# Patient Record
Sex: Male | Born: 1995 | Race: White | Hispanic: No | Marital: Single | Smoking: Never smoker
Health system: Southern US, Community
[De-identification: ages and names within clinical notes are randomized; demographics above are authoritative.]

## PROBLEM LIST (undated history)

## (undated) DIAGNOSIS — F840 Autistic disorder: Secondary | ICD-10-CM

## (undated) DIAGNOSIS — F319 Bipolar disorder, unspecified: Secondary | ICD-10-CM

---
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
## 2016-01-01 ENCOUNTER — Other Ambulatory Visit (HOSPITAL_COMMUNITY): Payer: Self-pay

## 2016-01-04 ENCOUNTER — Other Ambulatory Visit (HOSPITAL_COMMUNITY): Payer: Self-pay

## 2016-01-05 ENCOUNTER — Ambulatory Visit (HOSPITAL_COMMUNITY): Payer: Self-pay

## 2016-01-05 ENCOUNTER — Other Ambulatory Visit (HOSPITAL_COMMUNITY): Payer: Self-pay

## 2016-01-06 ENCOUNTER — Other Ambulatory Visit (HOSPITAL_COMMUNITY): Payer: Self-pay

## 2016-01-07 ENCOUNTER — Other Ambulatory Visit (HOSPITAL_COMMUNITY): Payer: Self-pay

## 2016-01-07 ENCOUNTER — Ambulatory Visit (HOSPITAL_COMMUNITY): Payer: Self-pay

## 2016-01-08 ENCOUNTER — Other Ambulatory Visit (HOSPITAL_COMMUNITY): Payer: Self-pay

## 2016-01-12 ENCOUNTER — Ambulatory Visit (HOSPITAL_COMMUNITY): Payer: Self-pay

## 2016-01-13 ENCOUNTER — Other Ambulatory Visit (HOSPITAL_COMMUNITY): Payer: Self-pay

## 2016-01-14 ENCOUNTER — Other Ambulatory Visit (HOSPITAL_COMMUNITY): Payer: Self-pay

## 2016-01-14 ENCOUNTER — Ambulatory Visit (HOSPITAL_COMMUNITY): Payer: Self-pay

## 2016-01-15 ENCOUNTER — Other Ambulatory Visit (HOSPITAL_COMMUNITY): Payer: Self-pay

## 2016-01-19 ENCOUNTER — Ambulatory Visit (HOSPITAL_COMMUNITY): Payer: Self-pay

## 2016-01-21 ENCOUNTER — Ambulatory Visit (HOSPITAL_COMMUNITY): Payer: Self-pay

## 2016-02-01 ENCOUNTER — Emergency Department (HOSPITAL_COMMUNITY)
Admission: EM | Admit: 2016-02-01 | Discharge: 2016-02-05 | Disposition: A | Discharge status: Home / Self Care | Payer: Commercial Managed Care - PPO | Attending: Emergency Medicine | Admitting: Emergency Medicine

## 2016-02-01 DIAGNOSIS — Y999 Unspecified external cause status: Secondary | ICD-10-CM | POA: Insufficient documentation

## 2016-02-01 DIAGNOSIS — Y929 Unspecified place or not applicable: Secondary | ICD-10-CM | POA: Insufficient documentation

## 2016-02-01 DIAGNOSIS — Z644 Discord with counselors: Secondary | ICD-10-CM | POA: Diagnosis not present

## 2016-02-01 DIAGNOSIS — Z658 Other specified problems related to psychosocial circumstances: Secondary | ICD-10-CM

## 2016-02-01 DIAGNOSIS — Z79899 Other long term (current) drug therapy: Secondary | ICD-10-CM | POA: Diagnosis not present

## 2016-02-01 DIAGNOSIS — X781XXA Intentional self-harm by knife, initial encounter: Secondary | ICD-10-CM | POA: Diagnosis not present

## 2016-02-01 DIAGNOSIS — S50812A Abrasion of left forearm, initial encounter: Secondary | ICD-10-CM

## 2016-02-01 DIAGNOSIS — S6992XA Unspecified injury of left wrist, hand and finger(s), initial encounter: Secondary | ICD-10-CM | POA: Diagnosis present

## 2016-02-01 DIAGNOSIS — Y939 Activity, unspecified: Secondary | ICD-10-CM | POA: Diagnosis not present

## 2016-02-01 DIAGNOSIS — F840 Autistic disorder: Secondary | ICD-10-CM | POA: Insufficient documentation

## 2016-02-01 LAB — CBC
HEMATOCRIT: 49.1 % (ref 39.0–52.0)
Hemoglobin: 16.7 g/dL (ref 13.0–17.0)
MCH: 30.4 pg (ref 26.0–34.0)
MCHC: 34 g/dL (ref 30.0–36.0)
MCV: 89.4 fL (ref 78.0–100.0)
Platelets: 250 10*3/uL (ref 150–400)
RBC: 5.49 MIL/uL (ref 4.22–5.81)
RDW: 12.1 % (ref 11.5–15.5)
WBC: 5.3 10*3/uL (ref 4.0–10.5)

## 2016-02-01 LAB — COMPREHENSIVE METABOLIC PANEL
ALBUMIN: 3.9 g/dL (ref 3.5–5.0)
ALT: 14 U/L — ABNORMAL LOW (ref 17–63)
ANION GAP: 8 (ref 5–15)
AST: 17 U/L (ref 15–41)
Alkaline Phosphatase: 83 U/L (ref 38–126)
BILIRUBIN TOTAL: 0.9 mg/dL (ref 0.3–1.2)
BUN: 8 mg/dL (ref 6–20)
CALCIUM: 9.8 mg/dL (ref 8.9–10.3)
CO2: 29 mmol/L (ref 22–32)
Chloride: 104 mmol/L (ref 101–111)
Creatinine, Ser: 1.14 mg/dL (ref 0.61–1.24)
GFR calc Af Amer: >60 mL/min (ref >60)
GFR calc non Af Amer: >60 mL/min (ref >60)
GLUCOSE: 92 mg/dL (ref 65–99)
Potassium: 4.6 mmol/L (ref 3.5–5.1)
Sodium: 141 mmol/L (ref 135–145)
TOTAL PROTEIN: 7.5 g/dL (ref 6.5–8.1)

## 2016-02-01 LAB — RAPID URINE DRUG SCREEN, HOSP PERFORMED
Amphetamines: NOT DETECTED
BARBITURATES: NOT DETECTED
Benzodiazepines: NOT DETECTED
COCAINE: NOT DETECTED
Opiates: NOT DETECTED
Tetrahydrocannabinol: NOT DETECTED

## 2016-02-01 LAB — SALICYLATE LEVEL: Salicylate Lvl: <7 mg/dL (ref 2.8–30.0)

## 2016-02-01 LAB — ACETAMINOPHEN LEVEL

## 2016-02-01 LAB — ETHANOL: Alcohol, Ethyl (B): <5 mg/dL (ref <5)

## 2016-02-01 LAB — VALPROIC ACID LEVEL: VALPROIC ACID LVL: 32 ug/mL — AB (ref 50.0–100.0)

## 2016-02-01 MED ORDER — TRAZODONE HCL 50 MG PO TABS
50.0000 mg | ORAL_TABLET | Freq: Every evening | ORAL | Status: DC | PRN
  Administered 2016-02-01 – 2016-02-03 (×3): 50 mg via ORAL
  Filled 2016-02-01 (×3): qty 1

## 2016-02-01 MED ORDER — HYDROXYZINE HCL 25 MG PO TABS
25.0000 mg | ORAL_TABLET | Freq: Four times a day (QID) | ORAL | Status: DC | PRN
  Administered 2016-02-01: 25 mg via ORAL
  Filled 2016-02-01: qty 1

## 2016-02-01 MED ORDER — ARIPIPRAZOLE 5 MG PO TABS
15.0000 mg | ORAL_TABLET | Freq: Every day | ORAL | Status: DC
  Administered 2016-02-02 – 2016-02-05 (×4): 15 mg via ORAL
  Filled 2016-02-01 (×4): qty 1

## 2016-02-01 MED ORDER — DIVALPROEX SODIUM ER 500 MG PO TB24
500.0000 mg | ORAL_TABLET | Freq: Every day | ORAL | Status: DC
  Administered 2016-02-01 – 2016-02-03 (×3): 500 mg via ORAL
  Filled 2016-02-01 (×3): qty 1

## 2016-02-01 NOTE — BH Assessment (Signed)
[facility] [place], [place], [place], [place]
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
## 2016-02-02 DIAGNOSIS — S50812A Abrasion of left forearm, initial encounter: Secondary | ICD-10-CM | POA: Diagnosis not present

## 2016-02-02 DIAGNOSIS — Z644 Discord with counselors: Secondary | ICD-10-CM | POA: Diagnosis not present

## 2016-02-02 NOTE — ED Notes (Signed)
Pt provided a snack.  
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
## 2016-02-02 NOTE — ED Notes (Signed)
A regular diet was ordered for Lunch. 

## 2016-02-02 NOTE — ED Notes (Signed)
Patient was given a snack and drink. 

## 2016-02-03 DIAGNOSIS — S50812A Abrasion of left forearm, initial encounter: Secondary | ICD-10-CM | POA: Diagnosis not present

## 2016-02-03 DIAGNOSIS — Z644 Discord with counselors: Secondary | ICD-10-CM | POA: Diagnosis not present

## 2016-02-03 NOTE — ED Notes (Signed)
Regular Diet has been ordered for Lunch. 

## 2016-02-03 NOTE — ED Notes (Signed)
Pt taken for shower.

## 2016-02-03 NOTE — ED Notes (Signed)
Pt received phone call from his brother. At desk presently, speaking on phone.

## 2016-02-03 NOTE — ED Notes (Signed)
A snack and drink was given to pt. And a order was taken for Lunch.

## 2016-02-03 NOTE — ED Provider Notes (Signed)
Sleeping comfortably   Kumar Falwell, MD 02/03/16 0905  

## 2016-02-03 NOTE — [facility] Counselor (Signed)
Client reassessed.  He continued to deny suicidal or homicidal ideation, reported that he wanted to return home.  Pt denied incidents detailed in the assessment, namely that he cut his forearm, locked himself in his room, and threatened to kill himself.  Consulted w/L Arville CareParks, NP who recommended continued inpatient.

## 2016-02-04 DIAGNOSIS — F840 Autistic disorder: Secondary | ICD-10-CM | POA: Diagnosis present

## 2016-02-04 DIAGNOSIS — Z644 Discord with counselors: Secondary | ICD-10-CM | POA: Diagnosis not present

## 2016-02-04 DIAGNOSIS — S50812A Abrasion of left forearm, initial encounter: Secondary | ICD-10-CM | POA: Diagnosis not present

## 2016-02-04 MED ORDER — DIVALPROEX SODIUM ER 500 MG PO TB24
1000.0000 mg | ORAL_TABLET | Freq: Every day | ORAL | Status: DC
Start: 2016-02-04 — End: 2016-02-05
  Administered 2016-02-04: 1000 mg via ORAL
  Filled 2016-02-04: qty 2

## 2016-02-04 NOTE — ED Notes (Signed)
[facility] about Mother's expressed concerns. NP will be speaking with [facility] about [facility].
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
## 2016-02-04 NOTE — ED Notes (Signed)
Lunch tray ordered for patient.

## 2016-02-04 NOTE — [facility] Counselor (Signed)
Pt denies SI/HI and AVH. Pt was calm and cooperative. Pt was coloring. Pt did not have any questions or concerns.  Wolfgang PhoenixBrandi Ane Conerly, [facility] Triage Specialist

## 2016-02-04 NOTE — ED Notes (Signed)
Spoke with patient's father. He is expressing possible concerns with D/C today due to possible delay in psychiatric treatment in outpatient setting. Father is requesting we speak with patient's mother.

## 2016-02-04 NOTE — ED Notes (Addendum)
This RN spoke with patient's mother and father about [facility]. Mother will call back in the A.M. to determine if d/c is still a likely outcome and address any remaining concerns. Mother indicated that the patient's psychiatrist is Dr. Jannifer FranklinAkintayo with [facility].  Father - Amparo Bristoldward Wentz - [phone]- [phone] Mother - Evonnie DawesLinda Woerner - [phone]

## 2016-02-04 NOTE — ED Notes (Signed)
Pt. Online with TTS.

## 2016-02-04 NOTE — Consult Note (Signed)
02-04-16  [name] received a telephone call from ClaiborneDustin, Charity fundraiserN at [facility] regarding family concerns over recommendation to discharge Pt today. Both parents are concerned that Pt will return home and decompensate without medication adjustment. The earliest appoint patient can get for outpatient psychiatry is Jan 27. Pt's mother would like medication adjustment prior to discharge. Pt is currently on Abilify 15 mg QD and Depakote ER 500 mg QHS. Pt's valporic acid level is sub therapeutic at 32. This writer would recommend the following:  Increase Depakote ER from to 1000mg  QHS PO  prior to discharge for mood stabilization.    Laveda AbbeLaurie Britton Parks MSN, FNP-BC [facility]

## 2016-02-05 DIAGNOSIS — Z644 Discord with counselors: Secondary | ICD-10-CM | POA: Diagnosis not present

## 2016-02-05 DIAGNOSIS — S50812A Abrasion of left forearm, initial encounter: Secondary | ICD-10-CM | POA: Diagnosis not present

## 2016-02-05 NOTE — Discharge Instructions (Signed)
Follow-up as per directions given by behavioral health staff.

## 2016-02-05 NOTE — Progress Notes (Signed)
CSW contacted pt's mother regarding guardianship concerns. CSW informed her to go to [facility] and file petition. CSW explained the process of petitioning and going before a judge. CSW also explained court system procedure and evaluation needs. Pt's medication has been adjusted by psych NP. Pt ready for d/c. Pt's mom to coordinate with pt's dad for p/u. CSW f/u with [facility], who stated f/u appt scheduled for 03/15/16. [phone]). CSW was able to get an earlier appt for 02/15/16 at 2:45pm. Information put on AVS. CSW signing off. Please contact should need arise.   Corlis HoveJeneya Zairah Arista, MSW, Theresia MajorsLCSWA [phone]

## 2016-02-05 NOTE — ED Notes (Signed)
Regular Diet has been Ordered. 

## 2016-02-05 NOTE — ED Notes (Signed)
Regular diet tray ordered for breakfast. 

## 2016-02-05 NOTE — ED Notes (Signed)
Patient was given a snack and drink, and a Regular Diet was taken for Lunch. 

## 2016-05-09 ENCOUNTER — Encounter (HOSPITAL_COMMUNITY): Payer: Self-pay | Admitting: Emergency Medicine

## 2016-05-09 DIAGNOSIS — Z79899 Other long term (current) drug therapy: Secondary | ICD-10-CM | POA: Insufficient documentation

## 2016-05-09 DIAGNOSIS — W2201XA Walked into wall, initial encounter: Secondary | ICD-10-CM | POA: Diagnosis not present

## 2016-05-09 DIAGNOSIS — Y999 Unspecified external cause status: Secondary | ICD-10-CM | POA: Diagnosis not present

## 2016-05-09 DIAGNOSIS — S0990XA Unspecified injury of head, initial encounter: Secondary | ICD-10-CM | POA: Diagnosis not present

## 2016-05-09 DIAGNOSIS — Y92410 Unspecified street and highway as the place of occurrence of the external cause: Secondary | ICD-10-CM | POA: Insufficient documentation

## 2016-05-09 DIAGNOSIS — Y939 Activity, unspecified: Secondary | ICD-10-CM | POA: Diagnosis not present
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
## 2016-05-10 ENCOUNTER — Emergency Department (HOSPITAL_COMMUNITY)
Admission: EM | Admit: 2016-05-10 | Discharge: 2016-05-10 | Disposition: A | Discharge status: Home / Self Care | Payer: Commercial Managed Care - PPO | Attending: Emergency Medicine | Admitting: Emergency Medicine

## 2016-05-10 ENCOUNTER — Emergency Department (HOSPITAL_COMMUNITY): Payer: Commercial Managed Care - PPO

## 2016-05-10 DIAGNOSIS — Z79899 Other long term (current) drug therapy: Secondary | ICD-10-CM | POA: Diagnosis not present

## 2016-05-10 DIAGNOSIS — S0990XA Unspecified injury of head, initial encounter: Secondary | ICD-10-CM | POA: Diagnosis not present

## 2016-05-10 DIAGNOSIS — R42 Dizziness and giddiness: Secondary | ICD-10-CM | POA: Diagnosis not present
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
## 2016-05-17 DIAGNOSIS — H9201 Otalgia, right ear: Secondary | ICD-10-CM | POA: Diagnosis not present

## 2016-05-17 DIAGNOSIS — H6121 Impacted cerumen, right ear: Secondary | ICD-10-CM | POA: Diagnosis not present

## 2017-04-18 DIAGNOSIS — Z13220 Encounter for screening for lipoid disorders: Secondary | ICD-10-CM | POA: Diagnosis not present

## 2017-04-18 DIAGNOSIS — Z0000 Encounter for general adult medical examination without abnormal findings: Secondary | ICD-10-CM | POA: Diagnosis not present

## 2017-04-18 DIAGNOSIS — Z130 Encounter for screening for diseases of the blood and blood-forming organs and certain disorders involving the immune mechanism: Secondary | ICD-10-CM | POA: Diagnosis not present

## 2017-04-28 DIAGNOSIS — Z130 Encounter for screening for diseases of the blood and blood-forming organs and certain disorders involving the immune mechanism: Secondary | ICD-10-CM | POA: Diagnosis not present

## 2017-04-28 DIAGNOSIS — Z0000 Encounter for general adult medical examination without abnormal findings: Secondary | ICD-10-CM | POA: Diagnosis not present

## 2017-04-28 DIAGNOSIS — Z13220 Encounter for screening for lipoid disorders: Secondary | ICD-10-CM | POA: Diagnosis not present

## 2017-10-04 DIAGNOSIS — M5431 Sciatica, right side: Secondary | ICD-10-CM | POA: Diagnosis not present

## 2017-10-04 DIAGNOSIS — M5441 Lumbago with sciatica, right side: Secondary | ICD-10-CM | POA: Diagnosis not present

## 2018-01-03 DIAGNOSIS — S62336A Displaced fracture of neck of fifth metacarpal bone, right hand, initial encounter for closed fracture: Secondary | ICD-10-CM | POA: Diagnosis not present

## 2018-01-03 DIAGNOSIS — W228XXA Striking against or struck by other objects, initial encounter: Secondary | ICD-10-CM | POA: Diagnosis not present

## 2018-01-03 DIAGNOSIS — S6991XA Unspecified injury of right wrist, hand and finger(s), initial encounter: Secondary | ICD-10-CM | POA: Diagnosis not present

## 2018-01-04 DIAGNOSIS — S62326A Displaced fracture of shaft of fifth metacarpal bone, right hand, initial encounter for closed fracture: Secondary | ICD-10-CM | POA: Diagnosis not present

## 2018-01-04 DIAGNOSIS — S62336A Displaced fracture of neck of fifth metacarpal bone, right hand, initial encounter for closed fracture: Secondary | ICD-10-CM | POA: Diagnosis not present

## 2018-01-04 DIAGNOSIS — S6991XA Unspecified injury of right wrist, hand and finger(s), initial encounter: Secondary | ICD-10-CM | POA: Diagnosis not present

## 2018-01-04 DIAGNOSIS — W228XXA Striking against or struck by other objects, initial encounter: Secondary | ICD-10-CM | POA: Diagnosis not present

## 2018-01-07 DIAGNOSIS — L03012 Cellulitis of left finger: Secondary | ICD-10-CM | POA: Diagnosis not present

## 2018-01-08 DIAGNOSIS — L03012 Cellulitis of left finger: Secondary | ICD-10-CM | POA: Diagnosis not present

## 2018-01-08 DIAGNOSIS — S62396D Other fracture of fifth metacarpal bone, right hand, subsequent encounter for fracture with routine healing: Secondary | ICD-10-CM | POA: Diagnosis not present

## 2018-02-14 IMAGING — CT CT HEAD W/O CM
4 series · 17 of 47 positions shown, 19 images · non-contrast
Comparison: None.

CLINICAL DATA: Acute onset of dizziness after ramming head into
wall. Initial encounter.

EXAM:
CT HEAD WITHOUT CONTRAST
TECHNIQUE: Contiguous axial images were obtained from the base of the skull
through the vertex without intravenous contrast.

[Series 3: head without · axial · non-contrast · 0.46mm/px · z∈[-107,+23]mm · 7 of 36 slices shown, 9 images]
[im 5/36  brain]
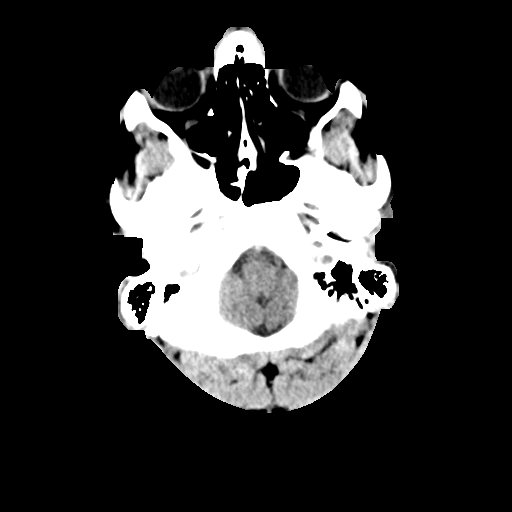
[im 5/36  bone]
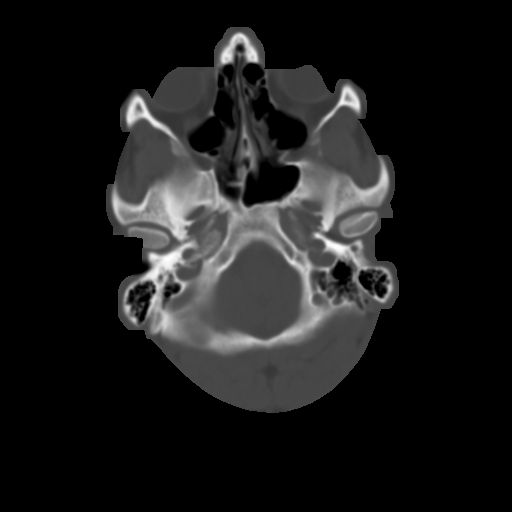
[im 9/36  brain]
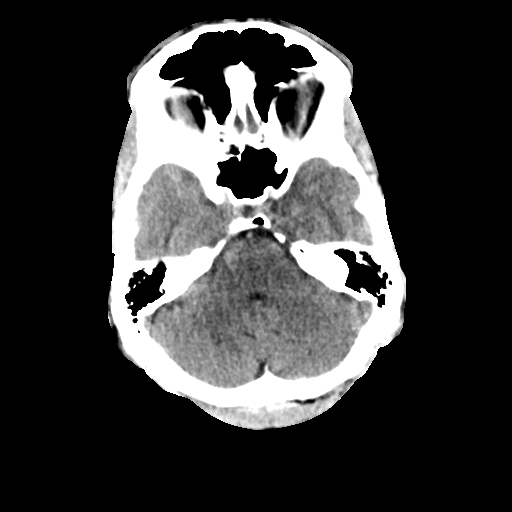
[im 14/36  brain]
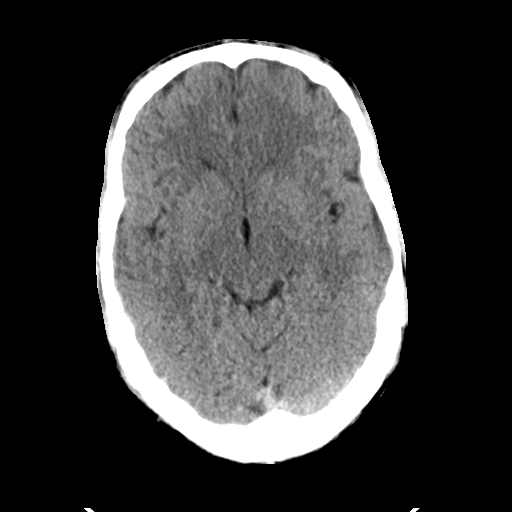
[im 18/36  brain]
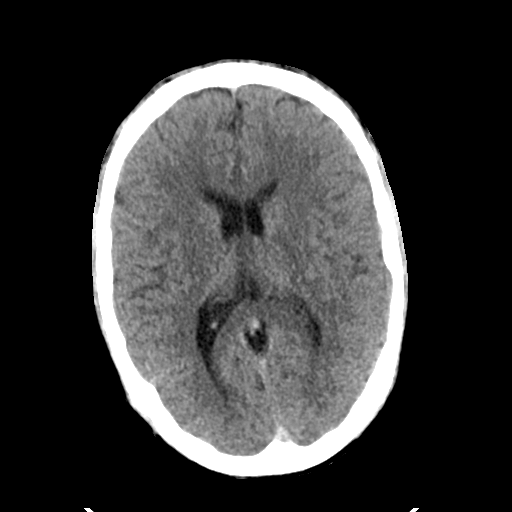
[im 22/36  brain]
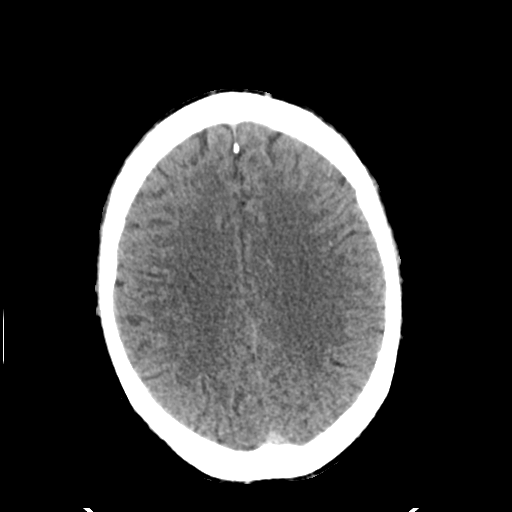
[im 22/36  bone]
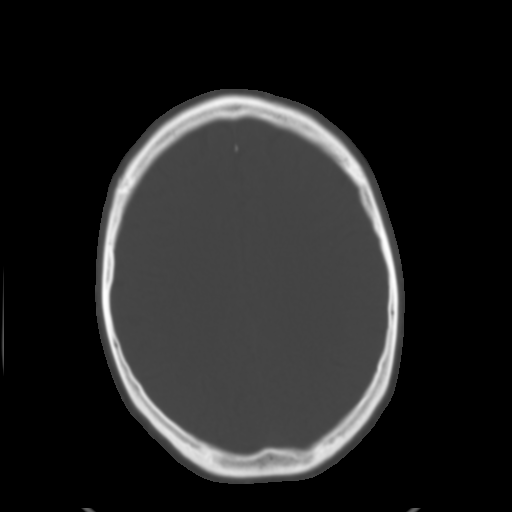
[im 27/36  brain]
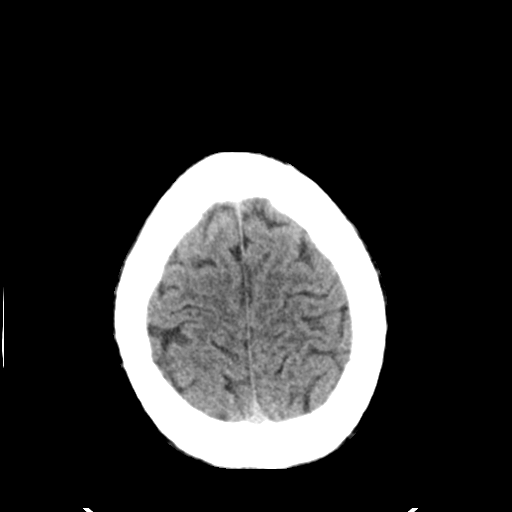
[im 31/36  brain]
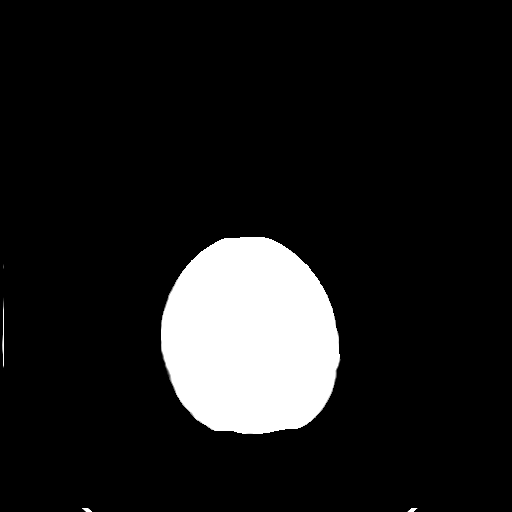

[Series 4: head bone · axial · 0.46mm/px · z∈[-111,-49]mm · 4 of 89 slices shown]
[im 9/89  bone]
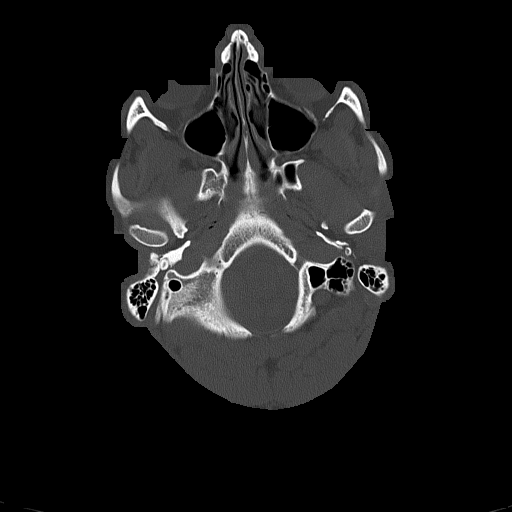
[im 18/89  bone]
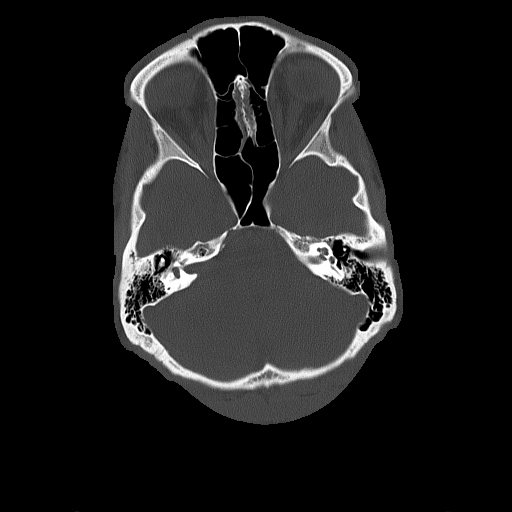
[im 27/89  bone]
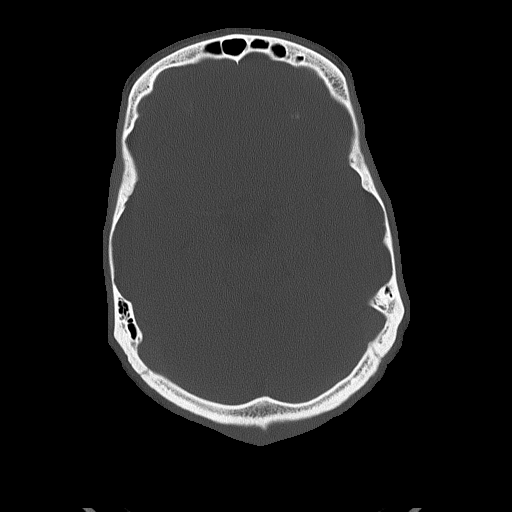
[im 40/89  bone]
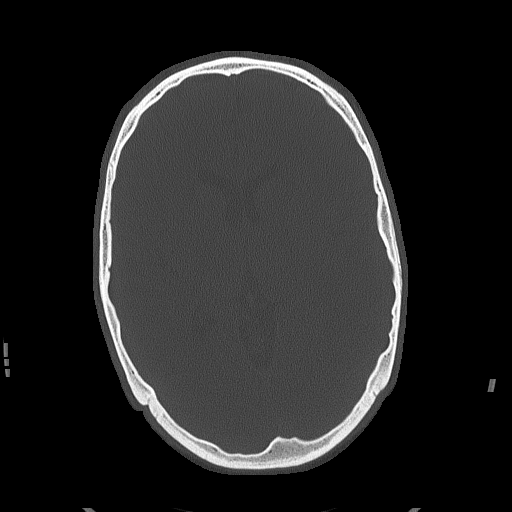

[Series 5: head without cor · coronal · non-contrast · 0.34mm/px · 3 of 72 slices shown]
[im 24/72  brain]
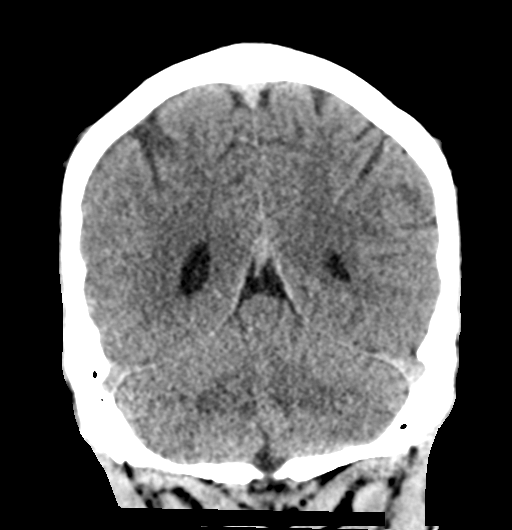
[im 32/72  brain]
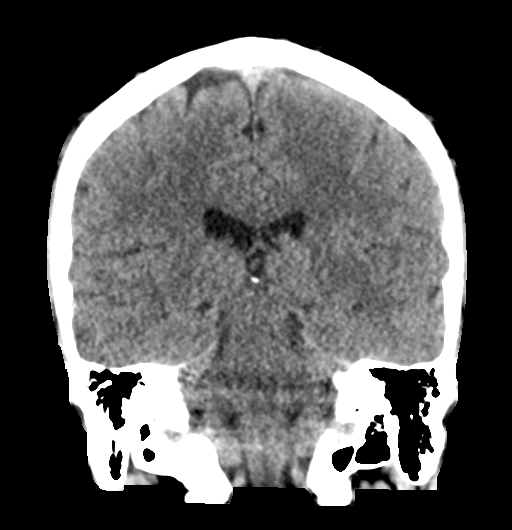
[im 40/72  brain]
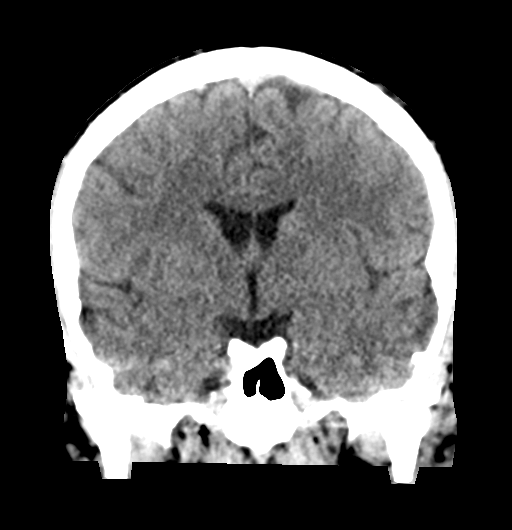

[Series 6: head without sag · sagittal · non-contrast · 0.38mm/px · 3 of 55 slices shown]
[im 19/55  brain]
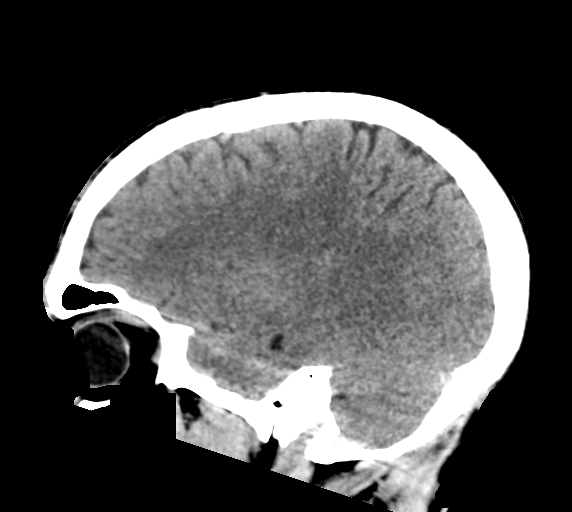
[im 28/55  brain]
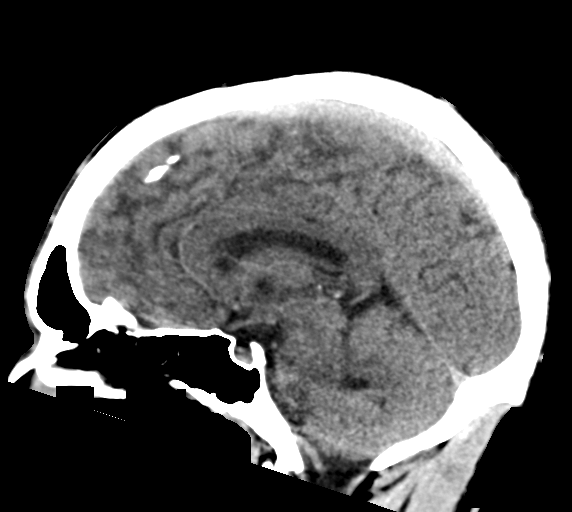
[im 37/55  brain]
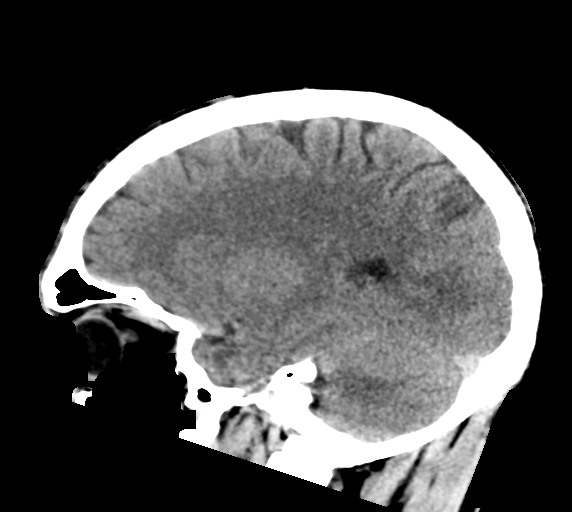

[17 of 47 positions shown; findings below may reference images not displayed]

FINDINGS: Brain: No evidence of acute infarction, hemorrhage, hydrocephalus,
extra-axial collection or mass lesion/mass effect.

The posterior fossa, including the cerebellum, brainstem and fourth
ventricle, is within normal limits. The third and lateral
ventricles, and basal ganglia are unremarkable in appearance. The
cerebral hemispheres are symmetric in appearance, with normal
gray-white differentiation. No mass effect or midline shift is seen.

Vascular: No hyperdense vessel or unexpected calcification.

Skull: There is no evidence of fracture; visualized osseous
structures are unremarkable in appearance.

Sinuses/Orbits: The orbits are within normal limits. The paranasal
sinuses and mastoid air cells are well-aerated.

Other: No significant soft tissue abnormalities are seen.
IMPRESSION: No evidence of traumatic intracranial injury or fracture.

## 2018-02-21 DIAGNOSIS — S62339A Displaced fracture of neck of unspecified metacarpal bone, initial encounter for closed fracture: Secondary | ICD-10-CM | POA: Diagnosis not present

## 2020-10-17 DIAGNOSIS — S90112A Contusion of left great toe without damage to nail, initial encounter: Secondary | ICD-10-CM

## 2020-10-17 NOTE — Discharge Instructions (Signed)
Your x-ray shows no evidence of broken bones.  You are likely dealing with contusion versus sprain/strain type injury.  Utilize Tylenol/ibuprofen as needed for pain and inflammation.    Utilize "PRICER" to guide your rehabilitation and recovery until you are able to follow up with your primary care provider or specialist.  P - Protect the injury: Immobilization (intermittently, unless instructed otherwise) using splinting, braces, crutches, etc.  R - Rest the injury: But not for too long! Excessive immobilization leads to joint stiffness - gentle stretches as dictated by pain; early mobilization when pain free!  I - Ice the injury: Not directly on the skin (use towel or other barrier to cover the ice) - ice in 20-30 minute intervals.  C - Compression: Reduces swelling and reduces excess tissue fluids, helps with venous blood return. Ace bandage, not too tight so it does not restrict your blood flow!  E - Elevation of injury: Use pillows to prop the injury up above the level of the heart, this reduces swelling. This is most helpful in the first 72 hours after injury.  R - Rehabilitation: Some soft tissue injuries especially around joints may require rehabilitation sooner rather than later to ensure proper healing and return to desired level of activity!

## 2020-10-17 NOTE — ED Provider Notes (Signed)
Emergency Department Encounter  [facility] ED    Patient: Gerald Barton  WGN:[id number]  DOB: Mar 13, 1995  Date of Evaluation: 10/17/2020  ED APP Provider: Soundra Pilon, PA-C      ChiefComplaint       Chief Complaint   Patient presents with    Foot Pain     HOPI     Tyan Dy is a 25 y.o. male with history of autism, bipolar disorder presenting with left foot and toe pain after kicking an object before coming to the ED.  Patient apparently was angry and kicked something, suddenly had left foot, first and second toe pain, felt he may have broken his toe.  Is able to ambulate on the affected foot.  Denies any other injuries.  Denies any other complaints.  He denies numbness tingling or weakness in the foot or toes.  Denies lacerations or abrasions or areas of bleeding.  Pain 3/10 aching in nature, worse with ambulation, improved with rest.  Historians include patient, mother at bedside.    ROS:     Review of Systems  5-9 systems reviewed and otherwise acutely negative except as in the HOPI.    Past History     Past Medical History:   Diagnosis Date    Autism     Bipolar 1 disorder (HCC)      History reviewed. No pertinent surgical history.  Social History     Socioeconomic History    Marital status: Single     Spouse name: None    Number of children: None    Years of education: None    Highest education level: None   Tobacco Use    Smoking status: Never    Smokeless tobacco: Never   Vaping Use    Vaping Use: Never used   Substance and Sexual Activity    Alcohol use: Never    Drug use: Yes     Frequency: 5.0 times per week     Types: Marijuana (Weed)       Medications/Allergies     There are no discharge medications for this patient.    No Known Allergies     Physical Exam       ED Triage Vitals [10/17/20 2115]   BP Temp Temp Source Heart Rate Resp SpO2 Height Weight   115/81 98.3 ??F (36.8 ??C) Temporal 77 18 94 % -- 207 lb (93.9 kg)     Physical Exam  Constitutional:       Appearance: Normal appearance.       Comments: Young male, nondistressed, cooperative.   HENT:      Head: Normocephalic and atraumatic.   Cardiovascular:      Rate and Rhythm: Normal rate and regular rhythm.      Heart sounds: Normal heart sounds. Heart sounds not distant. No murmur heard.  Pulmonary:      Effort: Pulmonary effort is normal.      Breath sounds: Normal breath sounds and air entry. No decreased breath sounds, wheezing, rhonchi or rales.   Musculoskeletal:      Cervical back: Full passive range of motion without pain, normal range of motion and neck supple.      Left foot: Normal range of motion. Swelling and tenderness present. No deformity.        Feet:    Skin:     General: Skin is warm.      Findings: No abrasion or laceration.   Neurological:      Mental Status:  He is alert.       Diagnostics   Labs:  Labs Reviewed - No data to display    Radiographs:  XR FOOT LEFT (MIN 3 VIEWS)    Result Date: 10/17/2020  Patient Name:                DONTRELL, STUCK MRN:                         [id number] Acct#:                       [id number] Diagnostic Radiology ACCESSION                 EXAM DATE/TIME           PROCEDURE                 ORDERING PROVIDER [phone]             10/17/2020 21:46 EDT      CR Foot Complete 3+       Benedict Kue, PA, Brian Kocourek  N Views Left CPT code 41287 Reason For Exam (CR Foot Complete 3+ Views Left) left 1st and 2nd toe pain Report EXAMINATION: Left foot three views  INDICATION: left 1st and 2nd toe pain FINDINGS: No acute fracture or dislocation is demonstrated. The joint spaces are grossly maintained. No articular erosions are noted. The soft tissues are grossly unremarkable.  IMPRESSION: No acute osseous abnormality. Report Dictated on Workstation: [id number] ---** Final ---** Dictating Physician: Lubertha Sayres, MD, [facility] Signed Date and Time: 10/17/2020 9:55 pm Signed by: Lubertha Sayres, MD, [facility] Transcribed Date and Time: 10/17/2020 9:57     Procedures:   N/A      EKG: All EKG's are interpreted by the Emergency  Department Physician in the absence of a cardiologist.  Please see their note for interpretation of EKG.    ED Course and MDM     I independently evaluated the patient with supervising attending physician available as needed for collaboration.    In brief, Kenner Lewan is a 25 y.o. male who presented to the emergency department for left foot/toes injury.  X-ray left foot shows no acute process by radiology and my review.  Clinical history and exam are consistent with likely foot/toe contusion or possibly sprain.  Discussed patient's imaging with him and mother, discussed care for toe contusion.  Recommended anti-inflammatories, rest, ice, elevation.  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  Recommended follow-up with PCP in 1 to 2 weeks if symptoms or not improving.  They denied any further questions or concerns.  Discharged in stable condition with follow-up.    Final Impression      1. Contusion of left great toe without damage to nail, initial encounter        DISPOSITION Decision To Discharge 10/17/2020 11:31:28 PM        Soundra Pilon, PA-C  [facility]      [place], [place]  10/18/20 [phone]

## 2020-10-17 NOTE — Other (Unsigned)
Patient Acct Nbr: [id number]   Primary AUTH/CERT:   Primary Insurance Company Name: [employer]  Primary Insurance Plan name: Medicare A  Primary Insurance Group Number:   Primary Insurance Plan Type: [place] A  Primary Insurance Policy Number: [id number]    Secondary AUTH/CERT:   Secondary Insurance Company Name: [employer]  Secondary Insurance Plan name: Medicare B  Secondary Insurance Group Number:   Secondary Insurance Plan Type: Vickii Chafe Insurance Policy Number: [id number]    Tertiary AUTH/CERT:   [employer] Name: [facility] Plan name: [facility] Insurance Group Number: [id number]  [facility] Insurance Plan Type: [employer] Policy Number: [id number]

## 2020-10-17 NOTE — ED Triage Notes (Signed)
Pt mother states that he was angry and kicked something with his left foot and began screaming in pain. Pt reports his pain has decreased since arrival to ED

## 2020-10-18 ENCOUNTER — Inpatient Hospital Stay: Admit: 2020-10-18 | Discharge: 2020-10-18 | Disposition: A | Discharge status: Home / Self Care

## 2020-10-18 ENCOUNTER — Inpatient Hospital Stay
Admission: EM | Admit: 2020-10-18 | Discharge: 2020-10-22 | Disposition: A | Discharge status: Home / Self Care | Payer: PRIVATE HEALTH INSURANCE | Admitting: Psychiatry

## 2020-10-18 DIAGNOSIS — S61512A Laceration without foreign body of left wrist, initial encounter: Principal | ICD-10-CM

## 2020-10-18 LAB — CBC WITH AUTO DIFFERENTIAL
Absolute Baso #: 0.1 10*3/uL (ref 0.0–0.2)
Absolute Eos #: 0.2 10*3/uL (ref 0.0–0.5)
Absolute Lymph #: 2.1 10*3/uL (ref 1.0–4.3)
Absolute Mono #: 0.5 10*3/uL (ref 0.0–0.8)
Absolute Neut #: 3.6 10*3/uL (ref 1.8–7.0)
Basophils: 2.2 % — ABNORMAL HIGH (ref 0.0–2.0)
Eosinophils: 3.5 % (ref 1.0–6.0)
Granulocytes %: 54.5 % (ref 40.0–80.0)
Hematocrit: 42.4 % (ref 40.0–52.0)
Hemoglobin: 14.3 g/dL (ref 13.0–18.0)
Lymphocyte %: 31.7 % (ref 20.0–40.0)
MCH: 30.8 pg (ref 26.0–34.0)
MCHC: 33.8 % (ref 32.0–36.0)
MCV: 91.2 fL (ref 80.0–98.0)
MPV: 8.1 fL (ref 7.4–12.4)
Monocytes: 8.1 % (ref 2.0–10.0)
Platelets: 230 10*3/uL (ref 140–440)
RBC: 4.65 10*6/uL (ref 4.40–5.90)
RDW: 12.8 % (ref 11.5–14.5)
WBC: 6.6 10*3/uL (ref 3.6–10.7)

## 2020-10-18 LAB — URINE DRUG SCREEN
Amphetamines, urine: NEGATIVE NA
Barbiturates, Urine: NEGATIVE NA
Benzodiazepine Ur Qual: NEGATIVE NA
Cocaine Metabolites, Ur: NEGATIVE NA
Methadone, Urine: NEGATIVE NA
Opiates, Urine: NEGATIVE NA
Oxycodone Screen, Ur: NEGATIVE NA
PCP, Urine: NEGATIVE NA

## 2020-10-18 LAB — COMPREHENSIVE METABOLIC PANEL
ALT: 17 U/L (ref 0–49)
AST: 27 U/L (ref 15–46)
Albumin,Serum: 4.2 g/dL (ref 3.5–5.0)
Alkaline Phosphatase: 87 U/L (ref 38–126)
Anion Gap: 9 mmol/L (ref 3–13)
BUN: 12 mg/dL (ref 7–17)
CO2: 26 mmol/L (ref 22–30)
Calcium: 9.3 mg/dL (ref 8.4–10.4)
Chloride: 104 mmol/L (ref 98–107)
Creatinine: 0.95 mg/dL (ref 0.52–1.25)
EGFR IF NonAfrican American: >90 mL/min (ref >60)
Glucose: 104 mg/dL — ABNORMAL HIGH (ref 70–100)
Potassium: 3.8 mmol/L (ref 3.5–5.1)
Sodium: 140 mmol/L (ref 135–145)
Total Bilirubin: 0.3 mg/dL (ref 0.2–1.3)
Total Protein: 7.1 g/dL (ref 6.3–8.2)
eGFR African American: >90 mL/min (ref >60)

## 2020-10-18 LAB — POCT COVID-19, ANTIGEN: SARS-CoV-2 Nucleocapsid Antigen: NEGATIVE NA

## 2020-10-18 LAB — ETHANOL: Ethanol Lvl: <0.01 g/dL (ref 0.000–0.010)

## 2020-10-18 MED ORDER — DIVALPROEX SODIUM ER 500 MG PO TB24
500 MG | Freq: Two times a day (BID) | ORAL | Status: AC
Start: 2020-10-18 — End: 2020-10-20
  Administered 2020-10-18 – 2020-10-20 (×5): 500 mg via ORAL

## 2020-10-18 MED ORDER — MELATONIN 5 MG PO TABS
5 MG | Freq: Every evening | ORAL | Status: AC | PRN
Start: 2020-10-18 — End: 2020-10-22
  Administered 2020-10-19 – 2020-10-22 (×3): 5 mg via ORAL

## 2020-10-18 MED ORDER — RISPERIDONE 3 MG PO TABS
3 MG | Freq: Every evening | ORAL | Status: AC
Start: 2020-10-18 — End: 2020-10-20
  Administered 2020-10-20: 01:00:00 3 mg via ORAL

## 2020-10-18 MED ORDER — CHOLECALCIFEROL 125 MCG (5000 UT) PO CAPS
125 MCG (5000 UT) | Freq: Every day | ORAL | Status: DC
Start: 2020-10-18 — End: 2020-10-20
  Administered 2020-10-18 – 2020-10-19 (×2): 5000 [IU] via ORAL

## 2020-10-18 MED ORDER — TETANUS-DIPHTH-ACELL PERTUSSIS 5-2.5-18.5 LF-MCG/0.5 IM SUSY
Freq: Once | INTRAMUSCULAR | Status: AC
Start: 2020-10-18 — End: 2020-10-18
  Administered 2020-10-18: 06:00:00 0.5 mL via INTRAMUSCULAR

## 2020-10-18 MED ORDER — LIDOCAINE-EPINEPHRINE 1 %-1:100000 IJ SOLN
1 %-:00000 | Freq: Once | INTRAMUSCULAR | Status: AC
Start: 2020-10-18 — End: 2020-10-18
  Administered 2020-10-18: 07:00:00 20 mL via INTRADERMAL

## 2020-10-18 MED ORDER — BENZTROPINE MESYLATE 0.5 MG PO TABS
0.5 MG | Freq: Two times a day (BID) | ORAL | Status: AC
Start: 2020-10-18 — End: 2020-10-22
  Administered 2020-10-18 – 2020-10-22 (×9): 0.5 mg via ORAL

## 2020-10-18 MED ORDER — ACETAMINOPHEN 325 MG PO TABS
325 MG | ORAL | Status: DC | PRN
Start: 2020-10-18 — End: 2020-10-19
  Administered 2020-10-18: 16:00:00 650 mg via ORAL

## 2020-10-18 MED FILL — BENZTROPINE MESYLATE 0.5 MG PO TABS: 0.5 MG | ORAL | Qty: 1

## 2020-10-18 MED FILL — BOOSTRIX 5-2.5-18.5 LF-MCG/0.5 IM SUSY: INTRAMUSCULAR | Qty: 0.5

## 2020-10-18 MED FILL — RISPERIDONE 3 MG PO TABS: 3 MG | ORAL | Qty: 1

## 2020-10-18 MED FILL — LIDOCAINE-EPINEPHRINE 1 %-1:100000 IJ SOLN: 1 %-:00000 | INTRAMUSCULAR | Qty: 20

## 2020-10-18 MED FILL — VITAMIN D3 125 MCG (5000 UT) PO CAPS: 125 MCG (5000 UT) | ORAL | Qty: 1

## 2020-10-18 MED FILL — TYLENOL 325 MG PO TABS: 325 MG | ORAL | Qty: 2

## 2020-10-18 MED FILL — DIVALPROEX SODIUM ER 500 MG PO TB24: 500 MG | ORAL | Qty: 1

## 2020-10-18 NOTE — ED Notes (Signed)
BEHAVIORAL HEALTH PATIENT INTAKE CHECKLIST    Orders    [x]   Notify Protective Services of patient in department:   10/18/20 7:08 AM EDT     [x]  Notified physician:  12/18/20, DO  10/18/20 7:08 AM EDT    [x]  Orders obtained as appropriate:     [x]  Sitter/Observer assigned         [x]  Patient notified of reason for body assessment and belongings search:    [x]  Remove all clothing from patient and place in gown and hospital slippers only. Nursing staff Abbi and Joaquin Courts X at bedside.      [x]  Remove all personal belongings from room. 12/18/20 X wanded Patient and belongings. Belongings inventoried (itemized inventory not applicable during Covid pandemic), including cell phone, sealed with ties and secured in designated area.     Documentation    [x]  Initiate Behavioral Health Companion Observation FlowSheet    [x]  Suicide Assessment via C-SSRS/         C-SSRS Frequent Screener Q12h and PRN    [x]  Constant Observation in place;  instructions provided for every 15 minute documentation of observations.    [x]  Initiate RN assessment and documentation.     Environmental Scan  These items or items similar should be removed from the room:   []  Chairs   [x]  Telephone   []  Trash cans and liners   [x]  Plastic utensils (order Patient Safety tray)   []  Empty or remove Sharps containers   [x]  All personal clothing/belongings removed   [x]  All unnecessary lead wires, electrical cords, draw cords, etc.   [x]  Any items that can be removed from wall should be.     Person completing Checklist: , RN          , RN  10/18/20 [phone]
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
## 2020-10-18 NOTE — ED Notes (Signed)
Pt wound covered as per Dr. Henry Russel order --> nonadherent dressing and clear covering.      Margaretmary Lombard, RN  10/18/20 [phone]

## 2020-10-18 NOTE — ED Notes (Signed)
Messaged Dr about PRN pain medication for pt laceration per pt request.      Park Breed, RN  10/18/20 1102

## 2020-10-19 MED ORDER — LORAZEPAM 2 MG/ML IJ SOLN
2 MG/ML | Freq: Once | INTRAMUSCULAR | Status: AC
Start: 2020-10-19 — End: 2020-10-19
  Administered 2020-10-19: 16:00:00 2 mg via INTRAMUSCULAR

## 2020-10-19 MED FILL — DIVALPROEX SODIUM ER 500 MG PO TB24: 500 MG | ORAL | Qty: 1

## 2020-10-19 MED FILL — BENZTROPINE MESYLATE 0.5 MG PO TABS: 0.5 MG | ORAL | Qty: 1

## 2020-10-19 MED FILL — LORAZEPAM 2 MG/ML IJ SOLN: 2 MG/ML | INTRAMUSCULAR | Qty: 1

## 2020-10-19 MED FILL — RISPERIDONE 3 MG PO TABS: 3 MG | ORAL | Qty: 1

## 2020-10-19 MED FILL — VITAMIN D3 125 MCG (5000 UT) PO CAPS: 125 MCG (5000 UT) | ORAL | Qty: 1

## 2020-10-19 MED FILL — MELATONIN 5 MG PO TABS: 5 MG | ORAL | Qty: 1

## 2020-10-19 NOTE — ED Notes (Signed)
Pt resting in bed. Mother at bedside.     Derrill Center, RN  10/19/20 [phone]

## 2020-10-19 NOTE — ED Notes (Signed)
Pt resting in bed. Mom at bedside.     Derrill Center, RN  10/19/20 1314

## 2020-10-19 NOTE — ED Notes (Addendum)
This RN spoke with Dr. Mindi Junker via phone. I asked clarifying question about admission order and if pt needs to be a PICA pt. States that he does need PICA at this time.     Jearld Pies, RN  10/19/20 1563       Jearld Pies, RN  10/19/20 [phone]

## 2020-10-19 NOTE — ED Notes (Signed)
Protective services present. Pt medicated with PRN Lorazepam without difficulty.     Derrill Center, RN  10/19/20 1319

## 2020-10-19 NOTE — ED Notes (Signed)
Report called to Zollie Scale, RN at [facility] Electronically signed by Marylou Flesher, RN on 10/19/2020 at 4:28 PM      Marylou Flesher, RN  10/19/20 [phone]

## 2020-10-19 NOTE — ED Notes (Signed)
Pt resting quietly in bed, Resp even, non labored. No signs of distress.     Derrill Center, RN  10/19/20 1320

## 2020-10-19 NOTE — Plan of Care (Signed)
Problem: Pain  Goal: Verbalizes/displays adequate comfort level or baseline comfort level  Outcome: Progressing     Problem: Anxiety  Goal: Will report anxiety at manageable levels  Description: INTERVENTIONS:  1. Administer medication as ordered  2. Teach and rehearse alternative coping skills  3. Provide emotional support with 1:1 interaction with staff  Outcome: Progressing     Problem: Behavior  Goal: Pt/Family maintain appropriate behavior and adhere to behavioral management agreement, if implemented  Description: INTERVENTIONS:  1. Assess patient/family's coping skills and  non-compliant behavior (including use of illegal substances)  2. Notify security of behavior or suspected illegal substances which indicate the need for search of the family and/or belongings  3. Encourage verbalization of thoughts and concerns in a socially appropriate manner  4. Utilize positive, consistent limit setting strategies supporting safety of patient, staff and others  5. Encourage participation in the decision making process about the behavioral management agreement  6. If a visitor's behavior poses a threat to safety call refer to organization policy.  7. Initiate consult with Social Worker, Psychosocial CNS, Spiritual Care as appropriate  Outcome: Progressing

## 2020-10-19 NOTE — ED Notes (Addendum)
Pt came out of room to the nurse station, demanding to be released, stating he doesn't need to be here. After being redirected to his room, pt began hitting the wall and yelling.      Derrill Center, RN  10/19/20 1315       Derrill Center, RN  10/19/20 [phone]
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
## 2020-10-19 NOTE — ED Notes (Signed)
Pt transported to [facility], via [facility]. Pt wanded prior to leaving. Belongings sent with [facility] ambulance Electronically signed by Marylou Flesher, RN on 10/19/2020 at 7:04 PM      Marylou Flesher, RN  10/19/20 [phone]

## 2020-10-20 LAB — HEMOGLOBIN A1C
Hemoglobin A1C: 5.5 %
eAG: 111 mg/dL

## 2020-10-20 LAB — LIPID PANEL
Chol/HDL Ratio: 4 NA
Cholesterol: 142 mg/dL (ref <200)
HDL: 39 mg/dL — ABNORMAL LOW (ref 40–60)
LDL Cholesterol: 89 mg/dL (ref <100)
Triglycerides: 72 mg/dL (ref <150)

## 2020-10-20 MED ORDER — VITAMIN D 50 MCG (2000 UT) PO TABS
50 MCG (2000 UT) | Freq: Every day | ORAL | Status: DC
Start: 2020-10-20 — End: 2020-10-22
  Administered 2020-10-20 – 2020-10-22 (×3): 5000 [IU] via ORAL

## 2020-10-20 MED ORDER — TRAZODONE HCL 50 MG PO TABS
50 MG | Freq: Every evening | ORAL | Status: DC | PRN
Start: 2020-10-20 — End: 2020-10-22
  Administered 2020-10-22: 01:00:00 50 mg via ORAL

## 2020-10-20 MED ORDER — BACITRACIN-NEOMYCIN-POLYMYXIN 400-5-5000 EX OINT
400-5-5000 | Freq: Two times a day (BID) | CUTANEOUS | Status: DC
Start: 2020-10-20 — End: 2020-10-22
  Administered 2020-10-20 – 2020-10-22 (×4): 1 via TOPICAL

## 2020-10-20 MED ORDER — POLYETHYLENE GLYCOL 3350 17 G PO PACK
17 g | Freq: Every day | ORAL | Status: DC | PRN
Start: 2020-10-20 — End: 2020-10-22

## 2020-10-20 MED ORDER — INFLUENZA VAC SUBUNIT QUAD 0.5 ML IM SUSY
0.5 ML | INTRAMUSCULAR | Status: DC
Start: 2020-10-20 — End: 2020-10-22

## 2020-10-20 MED ORDER — HALOPERIDOL 5 MG PO TABS
5 MG | Freq: Four times a day (QID) | ORAL | Status: DC | PRN
Start: 2020-10-20 — End: 2020-10-22

## 2020-10-20 MED ORDER — DIPHENHYDRAMINE HCL 50 MG/ML IJ SOLN
50 MG/ML | Freq: Four times a day (QID) | INTRAMUSCULAR | Status: DC | PRN
Start: 2020-10-20 — End: 2020-10-22

## 2020-10-20 MED ORDER — DIVALPROEX SODIUM ER 500 MG PO TB24
500 MG | Freq: Two times a day (BID) | ORAL | Status: DC
Start: 2020-10-20 — End: 2020-10-22
  Administered 2020-10-21 – 2020-10-22 (×4): 1000 mg via ORAL

## 2020-10-20 MED ORDER — MELATONIN 5 MG PO TABS
5 MG | Freq: Every evening | ORAL | Status: DC
Start: 2020-10-20 — End: 2020-10-22
  Administered 2020-10-21 – 2020-10-22 (×2): 5 mg via ORAL

## 2020-10-20 MED ORDER — ACETAMINOPHEN 325 MG PO TABS
325 | ORAL | Status: DC | PRN
Start: 2020-10-20 — End: 2020-10-22
  Administered 2020-10-20: 15:00:00 650 mg via ORAL

## 2020-10-20 MED ORDER — HALOPERIDOL LACTATE 5 MG/ML IJ SOLN
5 MG/ML | Freq: Four times a day (QID) | INTRAMUSCULAR | Status: AC | PRN
Start: 2020-10-20 — End: 2020-10-22

## 2020-10-20 MED ORDER — LORAZEPAM 2 MG/ML IJ SOLN
2 MG/ML | Freq: Four times a day (QID) | INTRAMUSCULAR | Status: DC | PRN
Start: 2020-10-20 — End: 2020-10-22

## 2020-10-20 MED ORDER — HYDROXYZINE PAMOATE 50 MG PO CAPS
50 MG | Freq: Three times a day (TID) | ORAL | Status: DC | PRN
Start: 2020-10-20 — End: 2020-10-22
  Administered 2020-10-20 – 2020-10-22 (×2): 50 mg via ORAL

## 2020-10-20 MED ORDER — LORAZEPAM 1 MG PO TABS
1 MG | Freq: Four times a day (QID) | ORAL | Status: AC | PRN
Start: 2020-10-20 — End: 2020-10-22

## 2020-10-20 MED ORDER — PALIPERIDONE PALMITATE ER 234 MG/1.5ML IM SUSY
2341.5 MG/1.5ML | INTRAMUSCULAR | Status: DC
Start: 2020-10-20 — End: 2020-10-22

## 2020-10-20 MED ORDER — DIPHENHYDRAMINE HCL 25 MG PO TABS
25 MG | Freq: Four times a day (QID) | ORAL | Status: AC | PRN
Start: 2020-10-20 — End: 2020-10-22

## 2020-10-20 MED FILL — TYLENOL 325 MG PO TABS: 325 MG | ORAL | Qty: 2

## 2020-10-20 MED FILL — BENZTROPINE MESYLATE 0.5 MG PO TABS: 0.5 MG | ORAL | Qty: 1

## 2020-10-20 MED FILL — TRIPLE ANTIBIOTIC EX OINT: CUTANEOUS | Qty: 1

## 2020-10-20 MED FILL — DIVALPROEX SODIUM ER 500 MG PO TB24: 500 MG | ORAL | Qty: 1

## 2020-10-20 MED FILL — RISPERIDONE 3 MG PO TABS: 3 MG | ORAL | Qty: 1

## 2020-10-20 MED FILL — MELATONIN 5 MG PO TABS: 5 MG | ORAL | Qty: 1

## 2020-10-20 MED FILL — VITAMIN D3 50 MCG (2000 UT) PO TABS: 50 MCG (2000 UT) | ORAL | Qty: 3

## 2020-10-20 MED FILL — HYDROXYZINE PAMOATE 50 MG PO CAPS: 50 MG | ORAL | Qty: 1
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
## 2020-10-20 NOTE — Consults (Signed)
Hospital Medicine Consult    Patient - Gerald Barton,  Age - 25 y.o.   DOB - 25-Feb-1995      Room Number - 2546/254601   Consulting - Para March, MD Primary Care Physician - No primary care provider on file.   MRN -  [id number]   Acct # - [id number]  Date of Admission -  10/18/2020 12:35 AM  Hospital Day - 2  Reason for Consult: Medical Management    HISTORY OF PRESENT ILLNESS:   Gerald Barton is a 25 y.o. male pmhx below. I reviewed patients chart and discussed with RN. Per chart review, patient presented to the emergency department for laceration and psychiatric evaluation.  Vitals reviewed.  Exam reveals a 10 cm laceration.  This was closed using sutures.  I did order preadmission work-up as patient appears agitated on exam.  Mother does not feel that home is a safe discharge plan.  Work-up essentially unremarkable and patient is cleared from psychiatric inpatient admission. [facility] consulted for medical management for left wrist laceration. On exam, patient is observed in dayroom visiting with peers. Patient ambulated to his room in no acute distress. Patient is calm and cooperative with my assessment. Patient reports pain to left wrist with drsg changes. No other complaints at this time. Denies chest pain, sob, abdominal pain, nausea, vomiting, diarrhea, constipation, fevers, or chills. Tolerating diet.     Past Medical History:       Diagnosis Date    Autism     Bipolar 1 disorder [facility])      Past Surgical History:  History reviewed. No pertinent surgical history.  Medications:  Scheduled Meds:   vitamin D3  5,000 Units Oral Daily    benztropine  0.5 mg Oral BID    divalproex  500 mg Oral BID    risperiDONE  3 mg Oral Nightly     Continuous Infusions:  PRN Meds:.acetaminophen, polyethylene glycol, diphenhydramine **AND** haloperidol **AND** LORazepam, diphenhydrAMINE **AND** haloperidol lactate **AND** LORazepam, traZODone, hydrOXYzine pamoate, melatonin  Allergies:  Patient has no known allergies.  Social  History:   Social History     Socioeconomic History    Marital status: Single     Spouse name: Not on file    Number of children: Not on file    Years of education: Not on file    Highest education level: Not on file   Occupational History    Not on file   Tobacco Use    Smoking status: Never    Smokeless tobacco: Never   Vaping Use    Vaping Use: Never used   Substance and Sexual Activity    Alcohol use: Never    Drug use: Yes     Frequency: 5.0 times per week     Types: Marijuana Sheran Fava)    Sexual activity: Not on file   Other Topics Concern    Not on file   Social History Narrative    Not on file     Social Determinants of Health     Financial Resource Strain: Not on file   Food Insecurity: Not on file   Transportation Needs: Not on file   Physical Activity: Not on file   Stress: Not on file   Social Connections: Not on file   Intimate Partner Violence: Not on file   Housing Stability: Not on file     Family History:   History reviewed. No pertinent family history.    REVIEW OF SYSTEMS:  10 point ROS  obtained, as per HPI, otherwise NEG    Physical Exam:    Vitals: BP 115/66    Pulse 59    Temp 97.8 ??F (36.6 ??C) (Temporal)    Resp 16    SpO2 98%   BMI Classification: Overweight (BMI 25.0-29.9)  Pulse Ox: SpO2  Avg: 97.3 %  Min: 93 %  Max: 99 %  Supplemental O2:    General appearance:  No apparent distress, appears stated age and cooperative with exam.  HEENT:  Normal cephalic, atraumatic without obvious deformity. Pupils equal, round, and reactive to light.  Extra ocular muscles intact. Conjunctivae/corneas clear.  Neck: Supple, with full range of motion. No jugular venous distention. Trachea midline. No lymphadenopathy.  Respiratory:  Normal respiratory effort. Clear to auscultation, bilaterally without Rales/Wheezes/Rhonchi.  Cardiovascular:  Regular rate and rhythm with normal S1/S2 without murmurs, rubs or gallops.  Abdomen: Soft, non-tender, non-distended with normal bowel sounds. No rebound or  guarding.  Musculoskeletal:  No clubbing, cyanosis or edema bilaterally.  Full range of motion without deformity.  Skin: Skin color, texture, turgor normal.  No rashes or lesions.  Neurologic:  Neurovascularly intact without any focal sensory/motor deficits. Cranial nerves: II-XII intact, grossly non-focal.  Psychiatric:  Alert and oriented, thought content appropriate, normal insight.    LABS:     Recent Results (from the past 24 hour(s))   Lipid Panel    Collection Time: 10/20/20  6:00 AM   Result Value Ref Range    Cholesterol 142 <200 mg/dL    Triglycerides 72 <161 mg/dL    HDL 39 (L) 40 - 60 mg/dL    LDL Cholesterol 89 <096 mg/dL    Chol/HDL Ratio 4 NA   Hemoglobin A1C    Collection Time: 10/20/20  6:00 AM   Result Value Ref Range    Hemoglobin A1C 5.5 %    eAG 111 mg/dL     Urine Culture:  No results found for this or any previous visit.    IMAGING:   See report    Assessment   1. L wrist laceration: 11 simple interrupted nylon sutures in place. No signs of infection  -wound care-->bid bacitracin and Non-adherent dressing  -pain control-->Tylenol prn  -monitor for infection   2. Autism spectrum disorder with behavioral disturbance   3. Bipolar I: Defer to primary care  Plan   -home medications as ordered  -am labs, replace lytes prn  -PT/OT/increase activity/pain control  -see below for additional orders; further recommendations to follow  Orders Placed This Encounter   Procedures    LACERATION REPAIR    XR WRIST LEFT 3 VW    CBC with Auto Differential    Comprehensive Metabolic Panel    Urine Drug Screen    Ethanol    Lipid Panel    Hemoglobin A1C    ADULT DIET; Regular    Vital signs per unit routine    Up as tolerated    Observation    Full Code    Consult to Internal Medicine    COVID-19, Antigen    EKG 12 Lead - Chest Pain    PATIENT STATUS (DIRECT) Inpatient    PATIENT STATUS (FROM ED OR OR/PROCEDURAL) Inpatient     Thank you for allowing [place] to participate in the care and management of this  patient.  Sonia Baller, APRN - CNP  Division of Hospitalist Medicine  Inpatient Medical Services/[facility]
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
## 2020-10-20 NOTE — Progress Notes (Signed)
Proper PPE was worn during all interaction on unit during shift     Reports no SI, HI or A/V hallucination on assessment when asked   compliant with medication  isolating in his room   will continue to monitor

## 2020-10-20 NOTE — Group Note (Signed)
Group Therapy Note    Date: 10/20/2020    Group Start Time: 1030  Group End Time: 1100  Group Topic: Group Therapy    [facility]    Gerre Scull        Group Therapy Note    Attendees: 4           Group Name:  Positive Coping skills - Self care     Group Description:   1)  The purpose of this group it to identify positive coping skills.  2)To improve self care health and wellness.    Patient Response: 1) Patient attended group was able to identify coping skills to reduce stress patient enjoys drawing.  2)Will continue to offer groups and encourage group participation.  3) Face mask and goggles worn at all times during patient interactions.                 Signature:  Emi Belfast

## 2020-10-20 NOTE — Progress Notes (Signed)
ACTIVITY THERAPY ASSESSMENT    Met with patient for activity therapy assessment. Patient was provided information on unit programming and questions were answered. Patient diagnosis and presenting complaint were reviewed including aggressive behavior. Reviewed and/or updated current living situation, cultural/spiritual preferences, education level, vocational status and mental status at time of this assessment.     Identified Goals  Treatment Involvement    Objective: Pt will participate in active treatment by attending groups.   Intervention: Pt will be offered 1 music or recreation therapy group daily and 2 general milieu therapy groups daily.      Recreation Therapy    Leisure Awareness: Workout     Healthy Leisure Engagement: Active     Leisure Barriers: Patient reports main barriers of physical limits.     Music Therapy     Favorite Band/Artist: [id number]     Music Preferences: Rap, Hip Hop, LoFi     Music Experiences/Skills: Gerlean Ren     Music triggers adverse reactions: None Identified       Signature: Cherly Hensen, CTRS

## 2020-10-20 NOTE — Progress Notes (Signed)
WOUND  Nutrition rescreen completed. Patient referred to the Dietitian.

## 2020-10-20 NOTE — Group Note (Signed)
Group Therapy Note    Date: 10/20/2020    Group Start Time: 1500  Group End Time: 1530  Group Topic: Group Therapy    [facility]    Gerre Scull        Group Therapy Note    Attendees: 6           Group Name:  Positive Coping skills - Self care     Group Description:   1)  The purpose of this group it to identify positive coping skills.  2)To improve self care health and wellness.    Patient Response:   Patient attended group choosing coping skills.   2)Will continue to offer groups and encourage group participation.  3) Face mask and goggles worn at all times during patient interactions.                 Signature:  Emi Belfast
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
## 2020-10-21 MED FILL — MELATONIN 5 MG PO TABS: 5 MG | ORAL | Qty: 1

## 2020-10-21 MED FILL — HYDROXYZINE PAMOATE 50 MG PO CAPS: 50 MG | ORAL | Qty: 1

## 2020-10-21 MED FILL — DIVALPROEX SODIUM ER 500 MG PO TB24: 500 MG | ORAL | Qty: 2

## 2020-10-21 MED FILL — BENZTROPINE MESYLATE 0.5 MG PO TABS: 0.5 MG | ORAL | Qty: 1

## 2020-10-21 MED FILL — TRIPLE ANTIBIOTIC EX OINT: CUTANEOUS | Qty: 1

## 2020-10-21 MED FILL — TRAZODONE HCL 50 MG PO TABS: 50 MG | ORAL | Qty: 1

## 2020-10-21 MED FILL — VITAMIN D3 50 MCG (2000 UT) PO TABS: 50 MCG (2000 UT) | ORAL | Qty: 3

## 2020-10-21 NOTE — Group Note (Signed)
Group Therapy Note    Date: 10/21/2020    Group Start Time: 1730  Group End Time: 1800  Group Topic: Group Therapy    [facility]    Gerald Barton        Group Therapy Note    Attendees: 4              Group Name:Share and learn         General Description/ purpose:    1)To encourage patient's to open up and share in a non - threatening environment share goals, talent ,unit  Q&A leaving patient on a positive and thoughtful note.       Patient Response: Patient actively engaged appropriately in group discussion.   Will continue to offer group activity encourage group participation.   Kn95 face mask and eye goggles worn at all times during patient interactions.                     Signature:  Emi Belfast

## 2020-10-21 NOTE — Progress Notes (Signed)
Pt pleasant and cooperative. Social with peers. Denies SI, HI and hallucinations. Compliant with his medication. Pt encouraged to seek staff with concerns.

## 2020-10-21 NOTE — Plan of Care (Signed)
Juven bid @ B/D, 1 choc. Ensure Max @ L daily.

## 2020-10-21 NOTE — Progress Notes (Signed)
Pt calm and cooperative.  Out in public areas, seen socializing with others.  Denies SI/HI.  Denies voices or hallucinations.  Denies pain, no distress.  Denies needs.  Full med compliance.    Wore PPE while out in public areas and interacting with patients.
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
## 2020-10-21 NOTE — Group Note (Signed)
Group Therapy Note    Date: 10/21/2020    Group Start Time: 1500  Group End Time: 1530  Group Topic: Group Therapy    [facility]    Gerre Scull        Group Therapy Note    Attendees: 4                                                   Group Name:  relax and rewind      Group Description: Promote relaxation developing positive coping skills reducing stress.  To explore multiple techniques to maintain wellness through stress management.    Materials: worksheet, pencil,paper drawing,music listening etc.    Discussion: benefits of relaxing and slowing down.        Patient Response:1)Patient was actively engaged in group reducing stress.  2)Will continue to offer groups and encourage group participation.  3) kn95 face mask and eye goggles worn at all times during  patient interactions.             Signature:  Emi Belfast

## 2020-10-21 NOTE — Plan of Care (Signed)
Problem: Anxiety  Goal: Will report anxiety at manageable levels  Description: INTERVENTIONS:  1. Administer medication as ordered  2. Teach and rehearse alternative coping skills  3. Provide emotional support with 1:1 interaction with staff  Outcome: Progressing     Problem: Behavior  Goal: Pt/Family maintain appropriate behavior and adhere to behavioral management agreement, if implemented  Description: INTERVENTIONS:  1. Assess patient/family's coping skills and  non-compliant behavior (including use of illegal substances)  2. Notify security of behavior or suspected illegal substances which indicate the need for search of the family and/or belongings  3. Encourage verbalization of thoughts and concerns in a socially appropriate manner  4. Utilize positive, consistent limit setting strategies supporting safety of patient, staff and others  5. Encourage participation in the decision making process about the behavioral management agreement  6. If a visitor's behavior poses a threat to safety call refer to organization policy.  7. Initiate consult with Social Worker, Psychosocial CNS, Spiritual Care as appropriate  Outcome: Progressing

## 2020-10-21 NOTE — Plan of Care (Signed)
Problem: Pain  Goal: Verbalizes/displays adequate comfort level or baseline comfort level  Outcome: Progressing     Problem: Anxiety  Goal: Will report anxiety at manageable levels  Description: INTERVENTIONS:  1. Administer medication as ordered  2. Teach and rehearse alternative coping skills  3. Provide emotional support with 1:1 interaction with staff  Outcome: Progressing

## 2020-10-21 NOTE — Progress Notes (Signed)
Pt calm and cooperative. Pt denies SI, HI, AH/VH. Pt med compliant and eating provided meals. Pt is intrusive about medications but is redirectable. Pt is social with peers and cooperative with interview. Proper PPE worn during interaction.  Electronically signed by Ruby Cola, RN on 10/21/2020 at 11:52 PM

## 2020-10-21 NOTE — Progress Notes (Signed)
BEHAVIORAL HEALTH INPATIENT DUAL DIAGNOSIS GROUP                                  HOSPITALIZATION DOCUMENTATION RECORD  Today's Date: 10/21/20  Start Time:  10:30 am     End Time:    11:00 am  Attendance: 6  [x]  Attended     []  Patient sick; unable to attend    []  Arrived late   []  Left early   []  Excused by staff    Service Type:   [x] Group Psychotherapy  [] Group Psychoeducation    [] Individual Counseling   [] Family Psycho Therapy   []  Activities Therapy   []  Multi-Family Group w/ Patient   []  Multi-Family Group w/o Patient     Name of Group:  [] Weekend Review Process   [] Day Review Process   []  CBT   []  DBT   [] Week Review Wrap-Up/Weekend Planning                  []  Expressive Therapy   []  Recreation Therapy   [x]  Psychoeducation Therapy -Dual Diagnosis    Treatment Goal:  [] Control Deterioration of Symptoms  [x] Improve or Maintain Level of Functioning   [] Improve Social Supports  [] Prevent Relapse & Avoid Hospitalization   [] Establish Concrete Steps Toward Goals   [] Enhance Communication Skills  [x] Expand Coping Skills  [] Develop Problem-Solving Skills  [] Identify Barriers to Wellness  [] Explore Core Issues Underlying Illness  [x] Relapse Preventions  []  Other:  Appearance:          Behavioral:   []  clean                   [x]  active  [] disheveled           []  fidgety  []  Other:                 []  withdrawn                                 []  tearful  Eye Contact:         []  assertive              [x]  good                   []  passive                []  poor                    []  aggressive   []  Other:        Attitude:                Level of                    [x]  cooperative        consciousness:  []  hostile                      [x]  alert  []  open                        []  drowsy  []  Evasive                   []  lethargic  []  secretive                 []   asleep  []  suspicious               []  confused  []  easily distracted      []  fluctuating   []  focused   []  defensive     Orientation:             Speech:    [x]  full                         []  talkative  []  person                   []  poverty    []  place                     []  expansive  []  time                       []  spontaneous  []  situation                []  fast                                    []  slow  Mood:                       [x]  normal   []  euthymic               []  pressured  []  energized              []  loud   []  anxious                 []  soft   [x]  depressed             []  strong    []  discouraged          []  weak   []   irritable                 []  slurred  []  angry                     []  Clear  []  edgy                      []  Other:  []  Other:    Affect:                       Thought               [x]  appropriate to        process:   situation                     [x] linear   []  broad                     []  goal directed   []  labile                      []  circumstantial  []  restricted                []  incoherent        []  blunted                   []  racing  [] flat                           []   blocking  [] detached                 []  Other:   [] indifferent  []  congruent    [] incongruent      Thought content:              Attention:  []  ruminations                    [x]  attentive  []  false perceptions           [] focused &  []  compulsive thoughts     concentrated  []  misinterpretations         []  easily   []  unshared beliefs             distracted   []  Other:    Social:                             Insight and    [x]  supportive                    judgement:   []  confrontational             [x]  insightful  []  argumentative              []  poor   []  superficial                           insight  []  attention seeking   []  disruptive  []  inter-active   []  engaging  []  monopolizing   Discussion Topic: Group read and discussed [employer] worksheet on the Serenity Prayer.  Each group member was asked what the prayer meant to them.         Patient's Response to Intervention: Pt was Actively . He was Able to verbalize current  knowledge/experience, Able to verbalize/acknowledge new learning, Able to retain information and Capable of insight.  Pt shared one goal they want to work on is ???I want to discharge and avoid pain as much as possible.???      Progress toward ST or LT Goals: []  Partial  [x]  Continue   []  Achieved      Electronically signed by , LPCC on 10/21/2020 at 12:22 PM
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
## 2020-10-22 MED ORDER — BACITRACIN-NEOMYCIN-POLYMYXIN 400-5-5000 EX OINT
400-5-5000 | CUTANEOUS | 0 refills | Status: AC
Start: 2020-10-22 — End: 2020-11-01

## 2020-10-22 MED ORDER — PALIPERIDONE PALMITATE ER 234 MG/1.5ML IM SUSY
234 MG/1.5ML | INTRAMUSCULAR | 1 refills | Status: AC
Start: 2020-10-22 — End: ?

## 2020-10-22 MED ORDER — DIVALPROEX SODIUM ER 500 MG PO TB24
500 MG | ORAL_TABLET | Freq: Two times a day (BID) | ORAL | 3 refills | Status: AC
Start: 2020-10-22 — End: ?

## 2020-10-22 MED ORDER — MELATONIN 3 MG PO TABS
3 MG | ORAL_TABLET | Freq: Every evening | ORAL | 1 refills | Status: AC
Start: 2020-10-22 — End: ?

## 2020-10-22 MED FILL — MELATONIN 5 MG PO TABS: 5 MG | ORAL | Qty: 1

## 2020-10-22 MED FILL — VITAMIN D3 50 MCG (2000 UT) PO TABS: 50 MCG (2000 UT) | ORAL | Qty: 3

## 2020-10-22 MED FILL — BENZTROPINE MESYLATE 0.5 MG PO TABS: 0.5 MG | ORAL | Qty: 1

## 2020-10-22 MED FILL — DIVALPROEX SODIUM ER 500 MG PO TB24: 500 MG | ORAL | Qty: 2

## 2020-10-22 NOTE — Plan of Care (Signed)
Problem: Pain  Goal: Verbalizes/displays adequate comfort level or baseline comfort level  Outcome: Progressing     Problem: Anxiety  Goal: Will report anxiety at manageable levels  Description: INTERVENTIONS:  1. Administer medication as ordered  2. Teach and rehearse alternative coping skills  3. Provide emotional support with 1:1 interaction with staff  10/22/2020 1313 by Ericka Pontiff, RN  Outcome: Progressing  Flowsheets (Taken 10/22/2020 1308)  Will report anxiety at manageable levels: Administer medication as ordered  10/21/2020 2340 by Ruby Cola, RN  Outcome: Progressing     Problem: Behavior  Goal: Pt/Family maintain appropriate behavior and adhere to behavioral management agreement, if implemented  Description: INTERVENTIONS:  1. Assess patient/family's coping skills and  non-compliant behavior (including use of illegal substances)  2. Notify security of behavior or suspected illegal substances which indicate the need for search of the family and/or belongings  3. Encourage verbalization of thoughts and concerns in a socially appropriate manner  4. Utilize positive, consistent limit setting strategies supporting safety of patient, staff and others  5. Encourage participation in the decision making process about the behavioral management agreement  6. If a visitor's behavior poses a threat to safety call refer to organization policy.  7. Initiate consult with Social Worker, Psychosocial CNS, Spiritual Care as appropriate  10/22/2020 1313 by Ericka Pontiff, RN  Outcome: Progressing  10/21/2020 2340 by Ruby Cola, RN  Outcome: Progressing     Problem: Nutrition Deficit:  Goal: Optimize nutritional status  Outcome: Progressing

## 2020-10-22 NOTE — Progress Notes (Signed)
Activity therapy Group  Attendance: 5    Start time: 0930    Duration: 30 minutes    Group Type: Music Therapy    Group Name: Recovery Strategies: "Music Trivia"    Group Description:   Patient was given the task of choosing and answering music trivia questions such as: guess the title, guess the artist, or finish the lyrics. The activity uses well known music to alleviate patient's hospital related anxiety by: engaging clients musically through finish the lyrics, helping direct patients' focus on identifying titles and artists of songs, improving patient's mood, affect, and group participation. Participation in the group will decrease patient's feeling of isolation.     Patient Response: Patient completed task  while displaying appropriate affect and group behavior including friendly competition with peers. Patient was observed to sing answers when asked to finish the lyrics.  Patient reported enjoyment of activity and endorsed positive feelings. Connections were made about the ability of music to help focus, manage mood and reduce anxiety.  Continue to encourage group participation to address stated treatment goals and objectives.    Seraphim Trow, MT-BC

## 2020-10-22 NOTE — Other (Unsigned)
Patient Acct Nbr: [id number]   Primary AUTH/CERT:   Primary Insurance Company Name: [facility]  Primary Insurance Plan name: [facility]  Primary Insurance Group Number: [id number]  Primary Insurance Plan Type: Health  Primary Insurance Policy Number: [id number]    Secondary AUTH/CERT:   Secondary Insurance Company Name: [employer]  Secondary Insurance Plan name: Medicare A  Secondary Insurance Group Number:   Secondary Insurance Plan Type: [employer] Insurance Policy Number: [id number]    Tertiary AUTH/CERT:   [employer] Name: [employer] Plan name: Medicare B  Tertiary Insurance Group Number:   [employer] Insurance Plan Type: [place] Number: [phone]
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
## 2020-10-22 NOTE — Care Coordination-Inpatient (Signed)
Met with patient discussed discharge and aftercare planning.  Per patient no thoughts of harm toward self or others.  Patient has a clear understanding of aftercare including with CSS.  Per patient report mother will be transporting home at discharge. Electronically signed by Doreen Beam, LISW on 10/22/2020 at 1:10 PM

## 2020-11-07 ENCOUNTER — Inpatient Hospital Stay
Admit: 2020-11-07 | Discharge: 2020-11-07 | Disposition: A | Discharge status: Home / Self Care | Attending: Emergency Medicine

## 2020-11-07 DIAGNOSIS — Z4802 Encounter for removal of sutures: Secondary | ICD-10-CM

## 2020-11-07 NOTE — Other (Unsigned)
Patient Acct Nbr: [id number]   Primary AUTH/CERT:   Primary Insurance Company Name: [facility]  Primary Insurance Plan name: [facility]  Primary Insurance Group Number: [id number]  Primary Insurance Plan Type: Health  Primary Insurance Policy Number: [id number]    Secondary AUTH/CERT:   Secondary Insurance Company Name: [employer]  Secondary Insurance Plan name: Medicare A  Secondary Insurance Group Number:   Secondary Insurance Plan Type: [employer] Insurance Policy Number: [id number]    Tertiary AUTH/CERT:   [employer] Name: [employer] Plan name: Medicare B  Tertiary Insurance Group Number:   [employer] Insurance Plan Type: [employer] Number: [phone]

## 2020-11-07 NOTE — ED Triage Notes (Addendum)
Stitches placed 3 weeks ago Jeris Penta, Charity fundraiser

## 2020-11-07 NOTE — ED Provider Notes (Signed)
[facility], [place] ED  EMERGENCY DEPARTMENT ENCOUNTER      Pt Name: Gerald Barton  MRN: [id number]  Birthdate [birth year]  Date of evaluation: 11/07/2020  Provider: Tawni Levy, MD     CHIEF COMPLAINT       Chief Complaint   Patient presents with    Suture / Staple Removal     Pt here to have sutures removed from left wrist         HISTORY OF PRESENT ILLNESS   (Location/Symptom, Timing/Onset, Context/Setting, Quality, Duration, Modifying Factors, Severity) Note limiting factors.   I wore a surgical mask for the entirety of this encounter.  HPI    Gerald Barton is a 25 y.o. male with a past medical history significant for bipolar disorder and autism who presents to the emergency department for suture removal.  According to the mom patient had sutures placed 3 weeks ago.  She states that she was out of town and the patient's father forgot to bring him to the ED.  She states she is about to leave again and wanted to make sure that this was taken care of.  Patient denies any pain from the breast lesion site or drainage.    Nursing Notes were reviewed.    REVIEW OF SYSTEMS    (2+ for level 4; 10+ for level 5)   Review of Systems   Constitutional:  Negative for activity change, appetite change and diaphoresis.   HENT:  Negative for congestion, facial swelling, sinus pressure, sinus pain, sneezing and trouble swallowing.    Eyes:  Negative for pain, discharge and visual disturbance.   Respiratory:  Negative for cough, chest tightness and shortness of breath.    Cardiovascular:  Negative for chest pain and palpitations.   Gastrointestinal:  Negative for abdominal pain, nausea and vomiting.   Genitourinary:  Negative for decreased urine volume, dysuria, hematuria and urgency.   Musculoskeletal:  Negative for back pain, neck pain and neck stiffness.   Skin:  Positive for wound (Left forearm). Negative for color change, pallor and rash.   Neurological:  Negative for dizziness, light-headedness and numbness.   Psychiatric/Behavioral:   Negative for confusion and hallucinations. The patient is not nervous/anxious.      PAST MEDICAL HISTORY     Past Medical History:   Diagnosis Date    Autism     Bipolar 1 disorder (HCC)        SURGICAL HISTORY     No past surgical history on file.    CURRENT MEDICATIONS       Discharge Medication List as of 11/07/2020  6:31 AM        CONTINUE these medications which have NOT CHANGED    Details   divalproex (DEPAKOTE ER) 500 MG extended release tablet Take 2 tablets by mouth 2 times daily, Disp-120 tablet, R-3Normal      paliperidone palmitate ER (INVEGA SUSTENNA) 234 MG/1.5ML SUSY IM injection Inject 234 mg into the muscle every 28 days (Next due 11/09/20), Disp-1.5 mL, R-1NO PRINT      melatonin 3 MG TABS tablet Take 1.5 tablets by mouth nightly, Disp-30 tablet, R-1NO PRINT      benztropine (COGENTIN) 0.5 MG tablet Take 0.5 mg by mouth 2 times dailyHistorical Med      Cholecalciferol (VITAMIN D3) 125 MCG (5000 UT) TABS Take by mouthHistorical Med             ALLERGIES     Patient has no known allergies.    FAMILY HISTORY  No family history on file.     SOCIAL HISTORY       Social History     Socioeconomic History    Marital status: Single   Tobacco Use    Smoking status: Never    Smokeless tobacco: Never   Vaping Use    Vaping Use: Never used   Substance and Sexual Activity    Alcohol use: Never    Drug use: Yes     Frequency: 5.0 times per week     Types: Marijuana (Weed)       SCREENINGS    Glasgow Coma Scale  Eye Opening: Spontaneous  Best Verbal Response: Oriented  Best Motor Response: Obeys commands  Glasgow Coma Scale Score: 15      PHYSICAL EXAM    (up to 7 for level 4, 8 or more for level 5)     ED Triage Vitals [11/07/20 0551]   BP Temp Temp Source Heart Rate Resp SpO2 Height Weight   121/72 97.6 ??F (36.4 ??C) Temporal 52 18 98 % -- --       Physical Exam  Vitals and nursing note reviewed.   Constitutional:       General: He is not in acute distress.     Appearance: He is not ill-appearing or  toxic-appearing.   HENT:      Head: Normocephalic and atraumatic.      Right Ear: External ear normal.      Left Ear: External ear normal.      Nose: No congestion or rhinorrhea.      Mouth/Throat:      Mouth: Mucous membranes are moist.      Pharynx: No oropharyngeal exudate.   Eyes:      General: No scleral icterus.        Right eye: No discharge.         Left eye: No discharge.      Conjunctiva/sclera: Conjunctivae normal.   Cardiovascular:      Rate and Rhythm: Normal rate.      Pulses: Normal pulses.      Heart sounds: No murmur heard.    No gallop.   Pulmonary:      Effort: Pulmonary effort is normal. No respiratory distress.      Breath sounds: Normal breath sounds. No stridor. No wheezing or rhonchi.   Abdominal:      General: Abdomen is flat. There is no distension.      Palpations: Abdomen is soft.      Tenderness: There is no abdominal tenderness.   Musculoskeletal:         General: No swelling, tenderness or deformity.      Cervical back: Normal range of motion and neck supple.   Skin:     General: Skin is warm.      Coloration: Skin is not jaundiced.      Findings: Laceration (Well-healed left forearm laceration with 11 sutures in place.) present. No bruising or rash.   Neurological:      General: No focal deficit present.      Mental Status: He is alert and oriented to person, place, and time.   Psychiatric:         Mood and Affect: Mood normal.         Behavior: Behavior normal.         Thought Content: Thought content normal.       DIAGNOSTIC RESULTS     Interpretation per the Radiologist below, if  available at the time of this note:  No results found.    ED BEDSIDE ULTRASOUND:   Performed by ED Physician - none    LABS:  Labs Reviewed - No data to display     All other labs were within normal range or not returned as of this dictation.    EMERGENCY DEPARTMENT COURSE and DIFFERENTIAL DIAGNOSIS/MDM:   Vitals:    Vitals:    11/07/20 0551   BP: 121/72   Pulse: 52   Resp: 18   Temp: 97.6 ??F (36.4 ??C)    TempSrc: Temporal   SpO2: 98%       Medications - No data to display    MDM  .  Patient presenting for evaluation for suture removal.  Sutures removed at bedside.  11 sutures removed.  Wound well approximated and healing.  Discussed wound care with the patient and mom.  Discussed reasons to return to the emergency department.  Discussed need to follow-up with primary care physician for reevaluation.  They verbalized understanding of information given and agreed to plan.  Patient discharged in stable condition.        CONSULTS:  None      PROCEDURES:  Unless otherwise noted below, none     Procedures    FINAL IMPRESSION      1. Encounter for removal of sutures          DISPOSITION/PLAN   DISPOSITION Decision To Discharge 11/07/2020 06:30:47 AM      PATIENT REFERRED TO:  [facility]  [address]  [place] [address]  [phone]  In 2 days  For wound re-check    DISCHARGE MEDICATIONS:  Discharge Medication List as of 11/07/2020  6:31 AM             (Please note:  Portions of this note were completed with a voice recognition program.  Efforts were made to edit the dictations but occasionally words and phrases are mis-transcribed.)  Form [id number]    Tawni Levy, MD (electronically signed)  Emergency Medicine Provider      Tawni Levy, MD  11/07/20 [phone]

## 2020-11-07 NOTE — Discharge Instructions (Signed)
Thank you for trusting us  with your care today.     You may use tylenol or ibuprofen as needed for pain.     Please make an appointment with your primary care doctor for reevalaution in 1-4 days.    Please return to the emergency department for any new concerning or worsening symptoms.
